# Patient Record
Sex: Male | Born: 1985 | Race: White | Hispanic: No | Marital: Married | Smoking: Current some day smoker
Health system: Southern US, Community
[De-identification: ages and names within clinical notes are randomized; demographics above are authoritative.]

---

## 2017-09-10 ENCOUNTER — Other Ambulatory Visit: Payer: Self-pay | Admitting: Physician Assistant

## 2017-09-10 DIAGNOSIS — N5089 Other specified disorders of the male genital organs: Secondary | ICD-10-CM

## 2017-09-17 ENCOUNTER — Ambulatory Visit
Admission: RE | Admit: 2017-09-17 | Discharge: 2017-09-17 | Disposition: A | Discharge status: Home / Self Care | Payer: Managed Care, Other (non HMO) | Source: Ambulatory Visit | Attending: Physician Assistant | Admitting: Physician Assistant

## 2017-09-17 DIAGNOSIS — N5089 Other specified disorders of the male genital organs: Secondary | ICD-10-CM

## 2019-12-18 DIAGNOSIS — W19XXXA Unspecified fall, initial encounter: Secondary | ICD-10-CM | POA: Diagnosis not present

## 2019-12-18 DIAGNOSIS — M25572 Pain in left ankle and joints of left foot: Secondary | ICD-10-CM | POA: Diagnosis not present

## 2019-12-18 DIAGNOSIS — M25512 Pain in left shoulder: Secondary | ICD-10-CM | POA: Diagnosis not present

## 2020-12-27 ENCOUNTER — Other Ambulatory Visit: Payer: Self-pay

## 2020-12-27 ENCOUNTER — Encounter: Payer: Self-pay | Admitting: Allergy

## 2020-12-27 ENCOUNTER — Ambulatory Visit (INDEPENDENT_AMBULATORY_CARE_PROVIDER_SITE_OTHER): Payer: BC Managed Care – PPO | Admitting: Allergy

## 2020-12-27 VITALS — BP 132/76 | HR 90 | Temp 98.4°F | Resp 16 | Ht 68.0 in | Wt 186.4 lb

## 2020-12-27 DIAGNOSIS — L509 Urticaria, unspecified: Secondary | ICD-10-CM | POA: Diagnosis not present

## 2020-12-27 MED ORDER — CETIRIZINE HCL 10 MG PO TABS: 10.0000 mg | ORAL_TABLET | Freq: Every day | ORAL | 5 refills | Status: DC

## 2020-12-27 NOTE — Patient Instructions (Addendum)
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   Start zyrtec (cetirizine) 10mg  once a day at night.  If hives are not controlled or causes drowsiness let know. [place] Keep track of episodes and take pictures of the rash.  . Avoid the following potential triggers: alcohol, tight clothing, NSAIDs, hot showers and getting overheated. . Get bloodwork:  o We are ordering labs, so please allow 1-2 weeks for the results to come back. o With the newly implemented Cures Act, the labs might be visible to you at the same time that they become visible to me. However, I will not address the results until all of the results are back, so please be patient.   Follow up in 2 months or sooner if needed.  Skin care recommendations  Bath time: . Always use lukewarm water. AVOID very hot or cold water. Marland Kitchen Keep bathing time to 5-10 minutes. . Do NOT use bubble bath. . Use a mild soap and use just enough to wash the dirty areas. . Do NOT scrub skin vigorously.  . After bathing, pat dry your skin with a towel. Do NOT rub or scrub the skin.  Moisturizers and prescriptions:  . ALWAYS apply moisturizers immediately after bathing (within 3 minutes). This helps to lock-in moisture. . Use the moisturizer several times a day over the whole body. Marland Kitchen summer moisturizers include: Aveeno, CeraVe, Cetaphil. [place] winter moisturizers include: Aquaphor, Vaseline, Cerave, Cetaphil, Eucerin, Vanicream. . When using moisturizers along with medications, the moisturizer should be applied about one hour after applying the medication to prevent diluting effect of the medication or moisturize around where you applied the medications. When not using medications, the moisturizer can be continued twice daily as maintenance.  Laundry and clothing: . Avoid laundry products with added color or perfumes. . Use unscented hypo-allergenic laundry products such as Tide free, Cheer free & gentle, and All free and clear.   . If the skin still seems dry or sensitive, you can try double-rinsing the clothes. . Avoid tight or scratchy clothing such as wool. . Do not use fabric softeners or dyer sheets.

## 2020-12-27 NOTE — Progress Notes (Signed)
New Patient Note  RE: Brent Petersen MRN: [id number] DOB: [birth year] Date of Office Visit: 12/27/2020  Referring provider: Lupita Raider, MD Primary care provider: Lupita Raider, MD  Chief Complaint: Urticaria  History of Present Illness: I had the pleasure of seeing [name] for initial evaluation at the [facility] on 12/27/2020. He is a 35 y.o. male, who is referred here by Lupita Raider, MD for the evaluation of urticaria.  Rash started about a 2 months ago. Mainly occurs on his shoulder blades, collarbone, waistline and around the socks. Describes them as itchy, red, raised. Individual rashes lasts about less than 1 day. No ecchymosis upon resolution. Associated symptoms include: none. Suspected triggers are pressure. Denies any fevers, chills, changes in medications, foods, personal care products or recent infections. He has tried the following therapies: benadryl prn with good benefit. Systemic steroids none. Currently on no daily medications.  Previous work up includes: none. Previous history of rash/hives: broke out after cat exposure, 2 outdoors cats at home.  Patient is up to date with the following cancer screening tests: no.  Wife had covid-19 in November but patient never had symptoms and tested negative. Wife is newly pregnant for about 2 months now.   Assessment and Plan: Brent Petersen is a 35 y.o. male with: Urticaria 2 months ago noted rash/hives around right shoulder, waistline and around the socks. Lasts less than 1 day. Denies changes in diet, meds or personal care products. Wife is 2 months pregnant, Covid-19 exposure in November 2021 but negative testing and asymptomatic. Hives with cat exposure in the past - 2 cat outdoors.   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   Start zyrtec (cetirizine) 10mg  once a day at night.  If hives are not controlled or causes drowsiness let know. [place] Keep track of episodes  and take pictures of the rash.  . Avoid the following potential triggers: alcohol, tight clothing, NSAIDs, hot showers and getting overheated. . Get bloodwork to rule out other etiologies.   Return in about 2 months (around 02/26/2021).  Meds ordered this encounter  Medications  . cetirizine (ZYRTEC ALLERGY) 10 MG tablet    Sig: Take 1 tablet (10 mg total) by mouth at bedtime.    Dispense:  30 tablet    Refill:  5    Lab Orders     Alpha-Gal Panel     ANA w/Reflex     CBC with Differential/Platelet     Chronic Urticaria     Comprehensive metabolic panel     Tryptase     Thyroid Cascade Profile     Sedimentation rate     C-reactive protein  Other allergy screening: Asthma: no Rhino conjunctivitis: no  Food allergy: no  Onions and peppers cause diarrhea.  Medication allergy: no Hymenoptera allergy: no Eczema:no History of recurrent infections suggestive of immunodeficency: no  Diagnostics: Skin Testing: None.  Past Medical History: Patient Active Problem List   Diagnosis Date Noted  . Urticaria 12/27/2020   History reviewed. No pertinent past medical history. Past Surgical History: History reviewed. No pertinent surgical history. Medication List:  Current Outpatient Medications  Medication Sig Dispense Refill  . cetirizine (ZYRTEC ALLERGY) 10 MG tablet Take 1 tablet (10 mg total) by mouth at bedtime. 30 tablet 5  . omeprazole (PRILOSEC) 20 MG capsule Take 20 mg by mouth daily.     No current facility-administered medications for this visit.   Allergies: No Known  Allergies Social History: Social History   Socioeconomic History  . Marital status: Married    Spouse name: Not on file  . Number of children: Not on file  . Years of education: Not on file  . Highest education level: Not on file  Occupational History  . Not on file  Tobacco Use  . Smoking status: Current Some Day Smoker  . Smokeless tobacco: Current User  Vaping Use  . Vaping Use: Never  used  Substance and Sexual Activity  . Alcohol use: Never  . Drug use: Never  . Sexual activity: Not on file  Other Topics Concern  . Not on file  Social History Narrative  . Not on file   Social Determinants of Health   Financial Resource Strain: Not on file  Food Insecurity: Not on file  Transportation Needs: Not on file  Physical Activity: Not on file  Stress: Not on file  Social Connections: Not on file   Lives in a house built in the 1900s. Smoking: 1/3rd pack per day Occupation: Health visitor History: Water Damage/mildew in the house: no Carpet in the family room: no Carpet in the bedroom: no Heating: gas Cooling: window Pet: yes 2 cats outdoors  Family History: Family History  Problem Relation Age of Onset  . Asthma Father    Problem                               Relation Eczema                                No Food allergy                          No  Allergic rhino conjunctivitis     No   Review of Systems  Constitutional: Negative for appetite change, chills, fever and unexpected weight change.  HENT: Negative for congestion and rhinorrhea.   Eyes: Negative for itching.  Respiratory: Negative for cough, chest tightness, shortness of breath and wheezing.   Cardiovascular: Negative for chest pain.  Gastrointestinal: Negative for abdominal pain.  Genitourinary: Negative for difficulty urinating.  Skin: Positive for rash.  Neurological: Negative for headaches.   Objective: BP 132/76 (BP Location: Right Arm, Patient Position: Sitting, Cuff Size: Large)   Pulse 90   Temp 98.4 F (36.9 C) (Temporal)   Resp 16   Ht 5\' 8"  (1.727 m)   Wt 186 lb 6.4 oz (84.6 kg)   SpO2 94%   BMI 28.34 kg/m  Body mass index is 28.34 kg/m. Physical Exam Vitals and nursing note reviewed.  Constitutional:      Appearance: Normal appearance. He is well-developed.  HENT:     Head: Normocephalic and atraumatic.     Right Ear: External ear normal.      Left Ear: External ear normal.     Nose: Nose normal.     Mouth/Throat:     Mouth: Mucous membranes are moist.     Pharynx: Oropharynx is clear.  Eyes:     Conjunctiva/sclera: Conjunctivae normal.  Cardiovascular:     Rate and Rhythm: Normal rate and regular rhythm.     Heart sounds: Normal heart sounds. No murmur heard. No friction rub. No gallop.   Pulmonary:     Effort: Pulmonary effort is normal.     Breath  sounds: Normal breath sounds. No wheezing, rhonchi or rales.  Abdominal:     Palpations: Abdomen is soft.  Musculoskeletal:     Cervical back: Neck supple.  Skin:    General: Skin is warm.     Findings: No rash.  Neurological:     Mental Status: He is alert and oriented to person, place, and time.  Psychiatric:        Behavior: Behavior normal.    The plan was reviewed with the patient/family, and all questions/concerned were addressed.  It was my pleasure to see Brent Petersen today and participate in his care. Please feel free to contact me with any questions or concerns.  Sincerely,  Wyline Mood, DO Allergy & Immunology  [facility] office: [phone] [facility] office: [phone]

## 2020-12-27 NOTE — Assessment & Plan Note (Signed)
2 months ago noted rash/hives around right shoulder, waistline and around the socks. Lasts less than 1 day. Denies changes in diet, meds or personal care products. Wife is 2 months pregnant, Covid-19 exposure in November 2021 but negative testing and asymptomatic. Hives with cat exposure in the past - 2 cat outdoors.   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   Start zyrtec (cetirizine) 10mg  once a day at night.  If hives are not controlled or causes drowsiness let know. [place] Keep track of episodes and take pictures of the rash.  . Avoid the following potential triggers: alcohol, tight clothing, NSAIDs, hot showers and getting overheated. . Get bloodwork to rule out other etiologies.

## 2020-12-28 ENCOUNTER — Encounter: Payer: Self-pay | Admitting: Allergy

## 2020-12-28 LAB — COMPREHENSIVE METABOLIC PANEL
Calcium: 9.8 mg/dL (ref 8.7–10.2)
Chloride: 100 mmol/L (ref 96–106)
Potassium: 4.9 mmol/L (ref 3.5–5.2)

## 2020-12-28 LAB — CBC WITH DIFFERENTIAL/PLATELET: EOS (ABSOLUTE): 0.3 10*3/uL (ref 0.0–0.4)

## 2020-12-28 LAB — ALPHA-GAL PANEL

## 2020-12-29 LAB — COMPREHENSIVE METABOLIC PANEL
AST: 41 IU/L — ABNORMAL HIGH (ref 0–40)
Glucose: 87 mg/dL (ref 65–99)
Total Protein: 7.9 g/dL (ref 6.0–8.5)

## 2020-12-29 LAB — ALPHA-GAL PANEL

## 2020-12-29 LAB — CBC WITH DIFFERENTIAL/PLATELET
Immature Granulocytes: 1 %
Neutrophils: 56 %

## 2020-12-31 LAB — CBC WITH DIFFERENTIAL/PLATELET
Hematocrit: 54.7 % — ABNORMAL HIGH (ref 37.5–51.0)
Immature Grans (Abs): 0.1 10*3/uL (ref 0.0–0.1)
MCH: 31.8 pg (ref 26.6–33.0)

## 2020-12-31 LAB — COMPREHENSIVE METABOLIC PANEL: Bilirubin Total: 0.3 mg/dL (ref 0.0–1.2)

## 2021-01-07 ENCOUNTER — Other Ambulatory Visit: Payer: Self-pay | Admitting: Allergy

## 2021-01-07 DIAGNOSIS — L509 Urticaria, unspecified: Secondary | ICD-10-CM

## 2021-01-07 LAB — COMPREHENSIVE METABOLIC PANEL
ALT: 60 IU/L — ABNORMAL HIGH (ref 0–44)
Albumin/Globulin Ratio: 1.7 (ref 1.2–2.2)
Albumin: 5 g/dL (ref 4.0–5.0)
Alkaline Phosphatase: 83 IU/L (ref 44–121)
BUN/Creatinine Ratio: 10 (ref 9–20)
BUN: 11 mg/dL (ref 6–20)
CO2: 17 mmol/L — ABNORMAL LOW (ref 20–29)
Creatinine, Ser: 1.1 mg/dL (ref 0.76–1.27)
Globulin, Total: 2.9 g/dL (ref 1.5–4.5)
Sodium: 141 mmol/L (ref 134–144)
eGFR: 91 mL/min/{1.73_m2} (ref >59)

## 2021-01-07 LAB — CBC WITH DIFFERENTIAL/PLATELET
Basophils Absolute: 0 10*3/uL (ref 0.0–0.2)
Basos: 1 %
Eos: 4 %
Hemoglobin: 18.8 g/dL — ABNORMAL HIGH (ref 13.0–17.7)
Lymphocytes Absolute: 1.9 10*3/uL (ref 0.7–3.1)
Lymphs: 28 %
MCHC: 34.4 g/dL (ref 31.5–35.7)
MCV: 92 fL (ref 79–97)
Monocytes Absolute: 0.7 10*3/uL (ref 0.1–0.9)
Monocytes: 10 %
Neutrophils Absolute: 3.9 10*3/uL (ref 1.4–7.0)
Platelets: 257 10*3/uL (ref 150–450)
RBC: 5.92 x10E6/uL — ABNORMAL HIGH (ref 4.14–5.80)
RDW: 12.8 % (ref 11.6–15.4)
WBC: 6.9 10*3/uL (ref 3.4–10.8)

## 2021-01-07 LAB — CHRONIC URTICARIA: cu index: <3.2 (ref <10)

## 2021-01-07 LAB — ALPHA-GAL PANEL
Allergen Lamb IgE: <0.1 kU/L
O215-IgE Alpha-Gal: <0.1 kU/L

## 2021-01-07 LAB — C-REACTIVE PROTEIN: CRP: 2 mg/L (ref 0–10)

## 2021-01-07 LAB — ANA W/REFLEX: Anti Nuclear Antibody (ANA): NEGATIVE

## 2021-01-07 LAB — SEDIMENTATION RATE: Sed Rate: 15 mm/hr (ref 0–15)

## 2021-01-07 LAB — TRYPTASE: Tryptase: 11.3 ug/L (ref 2.2–13.2)

## 2021-01-07 LAB — THYROID CASCADE PROFILE: TSH: 1.84 u[IU]/mL (ref 0.450–4.500)

## 2021-01-07 NOTE — Progress Notes (Signed)
Please mail lab orders to patient's home.

## 2021-02-28 ENCOUNTER — Other Ambulatory Visit: Payer: Self-pay

## 2021-02-28 ENCOUNTER — Encounter: Payer: Self-pay | Admitting: Allergy

## 2021-02-28 ENCOUNTER — Ambulatory Visit (INDEPENDENT_AMBULATORY_CARE_PROVIDER_SITE_OTHER): Payer: BC Managed Care – PPO | Admitting: Allergy

## 2021-02-28 VITALS — BP 130/82 | HR 88 | Temp 98.2°F | Resp 18 | Ht 68.0 in | Wt 190.0 lb

## 2021-02-28 DIAGNOSIS — L509 Urticaria, unspecified: Secondary | ICD-10-CM

## 2021-02-28 MED ORDER — CETIRIZINE HCL 10 MG PO TABS: 10.0000 mg | ORAL_TABLET | Freq: Every day | ORAL | 3 refills | Status: AC

## 2021-02-28 NOTE — Patient Instructions (Addendum)
Hives:  Continue zyrtec (cetirizine) 10mg  once a day at night.  If hives are not controlled or causes drowsiness let know. . If no hives for 1 month then you can try to decrease zyrtec to 5mg  once a day at night and see if that controls your symptoms.  [place] Keep track of episodes and take pictures of the rash.  . Avoid the following potential triggers: alcohol, tight clothing, NSAIDs, hot showers and getting overheated.  . Get bloodwork:  o We are ordering labs, so please allow 1-2 weeks for the results to come back. o With the newly implemented Cures Act, the labs might be visible to you at the same time that they become visible to me. However, I will not address the results until all of the results are back, so please be patient.  o In the meantime, continue recommendations in your patient instructions, including avoidance measures (if applicable), until you hear from me.  Follow up in 6 months or sooner if needed.  Skin care recommendations  Bath time: . Always use lukewarm water. AVOID very hot or cold water. Keep bathing time to 5-10 minutes. . Do NOT use bubble bath. . Use a mild soap and use just enough to wash the dirty areas. . Do NOT scrub skin vigorously.  . After bathing, pat dry your skin with a towel. Do NOT rub or scrub the skin.  Moisturizers and prescriptions:  . ALWAYS apply moisturizers immediately after bathing (within 3 minutes). This helps to lock-in moisture. . Use the moisturizer several times a day over the whole body. Marland Kitchen summer moisturizers include: Aveeno, CeraVe, Cetaphil. Marland Kitchen winter moisturizers include: Aquaphor, Vaseline, Cerave, Cetaphil, Eucerin, Vanicream. . When using moisturizers along with medications, the moisturizer should be applied about one hour after applying the medication to prevent diluting effect of the medication or moisturize around where you applied the medications. When not using medications, the moisturizer can be continued  twice daily as maintenance.  Laundry and clothing: . Avoid laundry products with added color or perfumes. . Use unscented hypo-allergenic laundry products such as Tide free, Cheer free & gentle, and All free and clear.  . If the skin still seems dry or sensitive, you can try double-rinsing the clothes. . Avoid tight or scratchy clothing such as wool. . Do not use fabric softeners or dyer sheets.

## 2021-02-28 NOTE — Assessment & Plan Note (Signed)
Past history - 2 months ago noted rash/hives around right shoulder, waistline and around the socks. Lasts less than 1 day. Denies changes in diet, meds or personal care products. Covid-19 exposure in November 2021 but negative testing and asymptomatic. Hives with cat exposure in the past - 2 cat outdoors.  Interim history - bloodwork slightly elevated AST/ALT and hgb/hct. Takes zyrtec 10mg  daily with good benefit.  Continue zyrtec (cetirizine) 10mg  once a day at night.  If hives are not controlled or causes drowsiness let know. . If no hives for 1 month then you can try to decrease zyrtec to 5mg  once a day at night and see if that controls your symptoms.  Keep track of episodes and take pictures of the rash.  . Avoid the following potential triggers: alcohol, tight clothing, NSAIDs, hot showers and getting overheated. . Get bloodwork - CBC diff and CMP.

## 2021-02-28 NOTE — Progress Notes (Signed)
Follow Up Note  RE: Brent Petersen MRN: [id number] DOB: [birth year] Date of Office Visit: 02/28/2021  Referring provider: Lupita Raider, MD Primary care provider: Lupita Raider, MD  Chief Complaint: Urticaria  History of Present Illness: I had the pleasure of seeing Brent Petersen for a follow up visit at the [facility] on 02/28/2021. He is a 35 y.o. male, who is being followed for urticaria. His previous allergy office visit was on 12/27/2020 with Dr. Selena Batten. Today is a regular follow up visit.  Urticaria Currently taking zyrtec 10mg  daily at night with no issues. Noted significant improvement in symptoms when taking it daily.  One time forgot to take for a few days and noted rash/itching.   Patient would like to get bloodwork done today.  Wife is pregnant and due in August 2022.  Assessment and Plan: Brent Petersen is a 35 y.o. male with: Urticaria Past history - 2 months ago noted rash/hives around right shoulder, waistline and around the socks. Lasts less than 1 day. Denies changes in diet, meds or personal care products. Covid-19 exposure in November 2021 but negative testing and asymptomatic. Hives with cat exposure in the past - 2 cat outdoors.  Interim history - bloodwork slightly elevated AST/ALT and hgb/hct. Takes zyrtec 10mg  daily with good benefit.  Continue zyrtec (cetirizine) 10mg  once a day at night.  If hives are not controlled or causes drowsiness let December 2021 know. . If no hives for 1 month then you can try to decrease zyrtec to 5mg  once a day at night and see if that controls your symptoms.  Keep track of episodes and take pictures of the rash.  . Avoid the following potential triggers: alcohol, tight clothing, NSAIDs, hot showers and getting overheated. . Get bloodwork - CBC diff and CMP.  Return in about 6 months (around 08/31/2021).  Meds ordered this encounter  Medications  . cetirizine (ZYRTEC ALLERGY) 10 MG tablet    Sig: Take 1 tablet (10 mg  total) by mouth daily.    Dispense:  90 tablet    Refill:  3    Lab Orders     CBC with Differential/Platelet     Comprehensive metabolic panel  Diagnostics: None.   Medication List:  Current Outpatient Medications  Medication Sig Dispense Refill  . cetirizine (ZYRTEC ALLERGY) 10 MG tablet Take 1 tablet (10 mg total) by mouth daily. 90 tablet 3  . omeprazole (PRILOSEC) 20 MG capsule Take 20 mg by mouth daily.     No current facility-administered medications for this visit.   Allergies: No Known Allergies I reviewed his past medical history, social history, family history, and environmental history and no significant changes have been reported from his previous visit.  Review of Systems  Constitutional: Negative for appetite change, chills, fever and unexpected weight change.  HENT: Negative for congestion and rhinorrhea.   Eyes: Negative for itching.  Respiratory: Negative for cough, chest tightness, shortness of breath and wheezing.   Cardiovascular: Negative for chest pain.  Gastrointestinal: Negative for abdominal pain.  Genitourinary: Negative for difficulty urinating.  Skin: Negative for rash.  Neurological: Negative for headaches.   Objective: BP 130/82 (BP Location: Left Arm, Patient Position: Sitting, Cuff Size: Normal)   Pulse 88   Temp 98.2 F (36.8 C) (Temporal)   Resp 18   Ht 5\' 8"  (1.727 m)   Wt 190 lb (86.2 kg)   SpO2 95%   BMI 28.89 kg/m  Body mass index is 28.89 kg/m.  Physical Exam Vitals and nursing note reviewed.  Constitutional:      Appearance: Normal appearance. He is well-developed.  HENT:     Head: Normocephalic and atraumatic.     Right Ear: External ear normal.     Left Ear: External ear normal.     Nose: Nose normal.     Mouth/Throat:     Mouth: Mucous membranes are moist.     Pharynx: Oropharynx is clear.  Eyes:     Conjunctiva/sclera: Conjunctivae normal.  Cardiovascular:     Rate and Rhythm: Normal rate and regular rhythm.      Heart sounds: Normal heart sounds. No murmur heard. No friction rub. No gallop.   Pulmonary:     Effort: Pulmonary effort is normal.     Breath sounds: Normal breath sounds. No wheezing, rhonchi or rales.  Abdominal:     Palpations: Abdomen is soft.  Musculoskeletal:     Cervical back: Neck supple.  Skin:    General: Skin is warm.     Findings: No rash.  Neurological:     Mental Status: He is alert and oriented to person, place, and time.  Psychiatric:        Behavior: Behavior normal.    Previous notes and tests were reviewed. The plan was reviewed with the patient/family, and all questions/concerned were addressed.  It was my pleasure to see Brent Petersen today and participate in his care. Please feel free to contact me with any questions or concerns.  Sincerely,  Wyline Mood, DO Allergy & Immunology  [facility] office: [phone] [facility] office: [phone]

## 2021-03-01 LAB — COMPREHENSIVE METABOLIC PANEL
ALT: 58 IU/L — ABNORMAL HIGH (ref 0–44)
AST: 30 IU/L (ref 0–40)
Albumin/Globulin Ratio: 1.4 (ref 1.2–2.2)
Albumin: 4.3 g/dL (ref 4.0–5.0)
Alkaline Phosphatase: 69 IU/L (ref 44–121)
BUN/Creatinine Ratio: 13 (ref 9–20)
BUN: 11 mg/dL (ref 6–20)
Bilirubin Total: 0.4 mg/dL (ref 0.0–1.2)
CO2: 25 mmol/L (ref 20–29)
Calcium: 9.4 mg/dL (ref 8.7–10.2)
Chloride: 103 mmol/L (ref 96–106)
Creatinine, Ser: 0.86 mg/dL (ref 0.76–1.27)
Globulin, Total: 3 g/dL (ref 1.5–4.5)
Glucose: 84 mg/dL (ref 65–99)
Potassium: 4.7 mmol/L (ref 3.5–5.2)
Sodium: 143 mmol/L (ref 134–144)
Total Protein: 7.3 g/dL (ref 6.0–8.5)
eGFR: 117 mL/min/{1.73_m2} (ref >59)

## 2021-03-01 LAB — CBC WITH DIFFERENTIAL/PLATELET
Basophils Absolute: 0.1 10*3/uL (ref 0.0–0.2)
Basos: 1 %
EOS (ABSOLUTE): 0.6 10*3/uL — ABNORMAL HIGH (ref 0.0–0.4)
Eos: 7 %
Hematocrit: 48.7 % (ref 37.5–51.0)
Hemoglobin: 16.5 g/dL (ref 13.0–17.7)
Immature Grans (Abs): 0.1 10*3/uL (ref 0.0–0.1)
Immature Granulocytes: 2 %
Lymphocytes Absolute: 2 10*3/uL (ref 0.7–3.1)
Lymphs: 23 %
MCH: 31.7 pg (ref 26.6–33.0)
MCHC: 33.9 g/dL (ref 31.5–35.7)
MCV: 94 fL (ref 79–97)
Monocytes Absolute: 0.8 10*3/uL (ref 0.1–0.9)
Monocytes: 9 %
Neutrophils Absolute: 5.1 10*3/uL (ref 1.4–7.0)
Neutrophils: 58 %
Platelets: 213 10*3/uL (ref 150–450)
RBC: 5.2 x10E6/uL (ref 4.14–5.80)
RDW: 13 % (ref 11.6–15.4)
WBC: 8.6 10*3/uL (ref 3.4–10.8)

## 2021-08-22 DIAGNOSIS — Z3009 Encounter for other general counseling and advice on contraception: Secondary | ICD-10-CM | POA: Diagnosis not present

## 2021-08-29 ENCOUNTER — Ambulatory Visit: Payer: BC Managed Care – PPO | Admitting: Allergy

## 2021-09-20 DIAGNOSIS — Z302 Encounter for sterilization: Secondary | ICD-10-CM | POA: Diagnosis not present

## 2022-01-16 DIAGNOSIS — R079 Chest pain, unspecified: Secondary | ICD-10-CM | POA: Diagnosis not present

## 2022-01-16 DIAGNOSIS — K219 Gastro-esophageal reflux disease without esophagitis: Secondary | ICD-10-CM | POA: Diagnosis not present

## 2022-01-16 DIAGNOSIS — Z23 Encounter for immunization: Secondary | ICD-10-CM | POA: Diagnosis not present

## 2022-01-16 DIAGNOSIS — E669 Obesity, unspecified: Secondary | ICD-10-CM | POA: Diagnosis not present

## 2022-01-30 DIAGNOSIS — J029 Acute pharyngitis, unspecified: Secondary | ICD-10-CM | POA: Diagnosis not present

## 2022-02-06 DIAGNOSIS — R079 Chest pain, unspecified: Secondary | ICD-10-CM | POA: Diagnosis not present

## 2022-02-06 DIAGNOSIS — M940 Chondrocostal junction syndrome [Tietze]: Secondary | ICD-10-CM | POA: Diagnosis not present

## 2022-02-06 DIAGNOSIS — F411 Generalized anxiety disorder: Secondary | ICD-10-CM | POA: Diagnosis not present

## 2022-02-08 ENCOUNTER — Other Ambulatory Visit: Payer: Self-pay | Admitting: Family Medicine

## 2022-02-08 ENCOUNTER — Ambulatory Visit
Admission: RE | Admit: 2022-02-08 | Discharge: 2022-02-08 | Disposition: A | Discharge status: Home / Self Care | Payer: BC Managed Care – PPO | Source: Ambulatory Visit | Attending: Family Medicine | Admitting: Family Medicine

## 2022-02-08 DIAGNOSIS — R079 Chest pain, unspecified: Secondary | ICD-10-CM

## 2022-03-06 DIAGNOSIS — R1013 Epigastric pain: Secondary | ICD-10-CM | POA: Diagnosis not present

## 2022-03-06 DIAGNOSIS — E7800 Pure hypercholesterolemia, unspecified: Secondary | ICD-10-CM | POA: Diagnosis not present

## 2022-03-06 DIAGNOSIS — K219 Gastro-esophageal reflux disease without esophagitis: Secondary | ICD-10-CM | POA: Diagnosis not present

## 2022-03-06 DIAGNOSIS — F411 Generalized anxiety disorder: Secondary | ICD-10-CM | POA: Diagnosis not present

## 2022-03-07 ENCOUNTER — Other Ambulatory Visit: Payer: Self-pay | Admitting: Physician Assistant

## 2022-03-07 DIAGNOSIS — R1013 Epigastric pain: Secondary | ICD-10-CM

## 2022-03-20 ENCOUNTER — Ambulatory Visit
Admission: RE | Admit: 2022-03-20 | Discharge: 2022-03-20 | Disposition: A | Discharge status: Home / Self Care | Payer: BC Managed Care – PPO | Source: Ambulatory Visit | Attending: Physician Assistant | Admitting: Physician Assistant

## 2022-03-20 DIAGNOSIS — R1013 Epigastric pain: Secondary | ICD-10-CM | POA: Diagnosis not present

## 2022-05-15 DIAGNOSIS — E7800 Pure hypercholesterolemia, unspecified: Secondary | ICD-10-CM | POA: Diagnosis not present

## 2022-05-22 DIAGNOSIS — H43391 Other vitreous opacities, right eye: Secondary | ICD-10-CM | POA: Diagnosis not present

## 2022-05-22 DIAGNOSIS — D3132 Benign neoplasm of left choroid: Secondary | ICD-10-CM | POA: Diagnosis not present

## 2022-05-22 DIAGNOSIS — Q141 Congenital malformation of retina: Secondary | ICD-10-CM | POA: Diagnosis not present

## 2022-11-27 DIAGNOSIS — H43391 Other vitreous opacities, right eye: Secondary | ICD-10-CM | POA: Diagnosis not present

## 2022-11-27 DIAGNOSIS — Q141 Congenital malformation of retina: Secondary | ICD-10-CM | POA: Diagnosis not present

## 2022-11-27 DIAGNOSIS — D3132 Benign neoplasm of left choroid: Secondary | ICD-10-CM | POA: Diagnosis not present

## 2022-12-25 DIAGNOSIS — R41840 Attention and concentration deficit: Secondary | ICD-10-CM | POA: Diagnosis not present

## 2022-12-25 DIAGNOSIS — F411 Generalized anxiety disorder: Secondary | ICD-10-CM | POA: Diagnosis not present

## 2023-04-23 DIAGNOSIS — Z5181 Encounter for therapeutic drug level monitoring: Secondary | ICD-10-CM | POA: Diagnosis not present

## 2023-04-23 DIAGNOSIS — Z0000 Encounter for general adult medical examination without abnormal findings: Secondary | ICD-10-CM | POA: Diagnosis not present

## 2023-04-23 DIAGNOSIS — F411 Generalized anxiety disorder: Secondary | ICD-10-CM | POA: Diagnosis not present

## 2023-04-23 DIAGNOSIS — E7800 Pure hypercholesterolemia, unspecified: Secondary | ICD-10-CM | POA: Diagnosis not present

## 2023-04-23 DIAGNOSIS — F17200 Nicotine dependence, unspecified, uncomplicated: Secondary | ICD-10-CM | POA: Diagnosis not present

## 2023-04-23 LAB — LAB REPORT - SCANNED
A1c: 5.5
EGFR: 101

## 2023-05-03 DIAGNOSIS — Z03818 Encounter for observation for suspected exposure to other biological agents ruled out: Secondary | ICD-10-CM | POA: Diagnosis not present

## 2023-05-03 DIAGNOSIS — R519 Headache, unspecified: Secondary | ICD-10-CM | POA: Diagnosis not present

## 2023-05-03 DIAGNOSIS — R509 Fever, unspecified: Secondary | ICD-10-CM | POA: Diagnosis not present

## 2023-05-03 DIAGNOSIS — J029 Acute pharyngitis, unspecified: Secondary | ICD-10-CM | POA: Diagnosis not present

## 2023-07-15 NOTE — Progress Notes (Unsigned)
Cardiology Office Note:    Date:  07/16/2023   ID:  Brent Petersen, DOB 31-Dec-1985, MRN [id number]  PCP:  Lupita Raider, MD   [facility] Providers Cardiologist:  None     Referring MD: Milus Height, PA   No chief complaint on file. High cholesterol  History of Present Illness:    Brent Petersen is a 37 y.o. male , active vaping pmhx, referral for HLD c/f FH. TC 305 mg/dL, LDL 409 mg/dL, HDL 38 mg/dL, TG 811 mg/dL 06/16/4781. He's taken crestor 20 mg daily at least since April 2024.  With his labs in July he went up to 40 mg daily.   Both of his parents have HLD. Father may have had a CABG; at 42.  His father's sister had a CABG recently.  Current Medications: Current Outpatient Medications on File Prior to Visit  Medication Sig Dispense Refill   cetirizine (ZYRTEC ALLERGY) 10 MG tablet Take 1 tablet (10 mg total) by mouth daily. 90 tablet 3   hydrOXYzine (ATARAX) 10 MG tablet Take 10 mg by mouth every 8 (eight) hours as needed.     omeprazole (PRILOSEC) 20 MG capsule Take 20 mg by mouth daily.     rosuvastatin (CRESTOR) 40 MG tablet Take 40 mg by mouth daily.     No current facility-administered medications on file prior to visit.     Allergies:   Patient has no known allergies.   Social History   Socioeconomic History   Marital status: Married    Spouse name: Not on file   Number of children: Not on file   Years of education: Not on file   Highest education level: Not on file  Occupational History   Not on file  Tobacco Use   Smoking status: Some Days   Smokeless tobacco: Current  Vaping Use   Vaping status: Never Used  Substance and Sexual Activity   Alcohol use: Never   Drug use: Never   Sexual activity: Not on file  Other Topics Concern   Not on file  Social History Narrative   Not on file   Social Determinants of Health   Financial Resource Strain: Not on file  Food Insecurity: Not on file  Transportation Needs: Not on file  Physical  Activity: Not on file  Stress: Not on file  Social Connections: Not on file    Family History: The patient's family history includes Asthma in his father.  ROS:   Please see the history of present illness.     All other systems reviewed and are negative.  EKGs/Labs/Other Studies Reviewed:    The following studies were reviewed today:   EKG Interpretation Date/Time:  Monday July 16 2023 10:01:12 EDT Ventricular Rate:  70 PR Interval:  158 QRS Duration:  80 QT Interval:  366 QTC Calculation: 395 R Axis:   34  Text Interpretation: Normal sinus rhythm Normal ECG No previous ECGs available Confirmed by Carolan Clines ([id number]) on 07/16/2023 10:13:48 AM    Recent Labs: No results found for requested labs within last 365 days.   Recent Lipid Panel No results found for: "CHOL", "TRIG", "HDL", "CHOLHDL", "VLDL", "LDLCALC", "LDLDIRECT"   Risk Assessment/Calculations:          Physical Exam:    VS:   Vitals:   07/16/23 0954  BP: 128/80  Pulse: 70  SpO2: 95%     Wt Readings from Last 3 Encounters:  07/16/23 180 lb (81.6 kg)  02/28/21 190 lb (86.2 kg)  12/27/20 186 lb 6.4 oz (84.6 kg)     Physical Exam Gen: well appearing Neuro: alert and oriented CV: r,r,r no murmurs.  Pulm: CLAB Abd: non distended Ext: No LE edema Skin: warm and well perfused Psych: normal mood ASSESSMENT:    Likely has FH  PLAN:    In order of problems listed above:   Fasting lipids today Continue crestor 40 mg daily Referral to lipid clinic     Medication Adjustments/Labs and Tests Ordered: Current medicines are reviewed at length with the patient today.  Concerns regarding medicines are outlined above.  Orders Placed This Encounter  Procedures   EKG 12-Lead   No orders of the defined types were placed in this encounter.   There are no Patient Instructions on file for this visit.   Signed, Maisie Fus, MD  07/16/2023 10:14 AM    [facility]

## 2023-07-16 ENCOUNTER — Encounter: Payer: Self-pay | Admitting: Internal Medicine

## 2023-07-16 ENCOUNTER — Ambulatory Visit: Payer: BC Managed Care – PPO | Attending: Internal Medicine | Admitting: Internal Medicine

## 2023-07-16 VITALS — BP 128/80 | HR 70 | Ht 68.0 in | Wt 180.0 lb

## 2023-07-16 DIAGNOSIS — Z136 Encounter for screening for cardiovascular disorders: Secondary | ICD-10-CM

## 2023-07-16 LAB — LIPID PANEL
Chol/HDL Ratio: 4.3 {ratio} (ref 0.0–5.0)
Cholesterol, Total: 190 mg/dL (ref 100–199)
HDL: 44 mg/dL (ref >39)
LDL Chol Calc (NIH): 122 mg/dL — ABNORMAL HIGH (ref 0–99)
Triglycerides: 136 mg/dL (ref 0–149)
VLDL Cholesterol Cal: 24 mg/dL (ref 5–40)

## 2023-07-16 NOTE — Patient Instructions (Signed)
Medication Instructions:  Your physician recommends that you continue on your current medications as directed. Please refer to the Current Medication list given to you today.  *If you need a refill on your cardiac medications before your next appointment, please call your pharmacy*   Lab Work: TODAY:  LIPID  If you have labs (blood work) drawn today and your tests are completely normal, you will receive your results only by: MyChart Message (if you have MyChart) OR A paper copy in the mail If you have any lab test that is abnormal or we need to change your treatment, we will call you to review the results.   Testing/Procedures: None ordered   You have been referred to Dr. Rennis Golden in the Lipid Clinic    Follow-Up: At [facility], you and your health needs are our priority.  As part of our continuing mission to provide you with exceptional heart care, we have created designated Provider Care Teams.  These Care Teams include your primary Cardiologist (physician) and Advanced Practice Providers (APPs -  Physician Assistants and Nurse Practitioners) who all work together to provide you with the care you need, when you need it.  We recommend signing up for the patient portal called "MyChart".  Sign up information is provided on this After Visit Summary.  MyChart is used to connect with patients for Virtual Visits (Telemedicine).  Patients are able to view lab/test results, encounter notes, upcoming appointments, etc.  Non-urgent messages can be sent to your provider as well.   To learn more about what you can do with MyChart, go to [email or web address].    Your next appointment:      Provider:       Other Instructions

## 2023-07-17 ENCOUNTER — Other Ambulatory Visit: Payer: Self-pay | Admitting: Internal Medicine

## 2023-07-17 ENCOUNTER — Other Ambulatory Visit (HOSPITAL_COMMUNITY): Payer: Self-pay

## 2023-07-17 DIAGNOSIS — E785 Hyperlipidemia, unspecified: Secondary | ICD-10-CM

## 2023-07-17 MED ORDER — EZETIMIBE 10 MG PO TABS
10.0000 mg | ORAL_TABLET | Freq: Every day | ORAL | 3 refills | Status: DC
  Filled 2023-07-17 – 2023-07-31 (×2): qty 30, 30d supply, fill #0

## 2023-07-27 ENCOUNTER — Other Ambulatory Visit (HOSPITAL_COMMUNITY): Payer: Self-pay

## 2023-07-30 IMAGING — US US ABDOMEN LIMITED
1 series · 14 of 25 positions shown · non-contrast
Comparison: None Available.

CLINICAL DATA: Epigastric pain

EXAM:
ULTRASOUND ABDOMEN LIMITED RIGHT UPPER QUADRANT

[Series 1: us abdomen limited · 0.20mm/px · 14 of 47 slices shown]
[im 1/47]
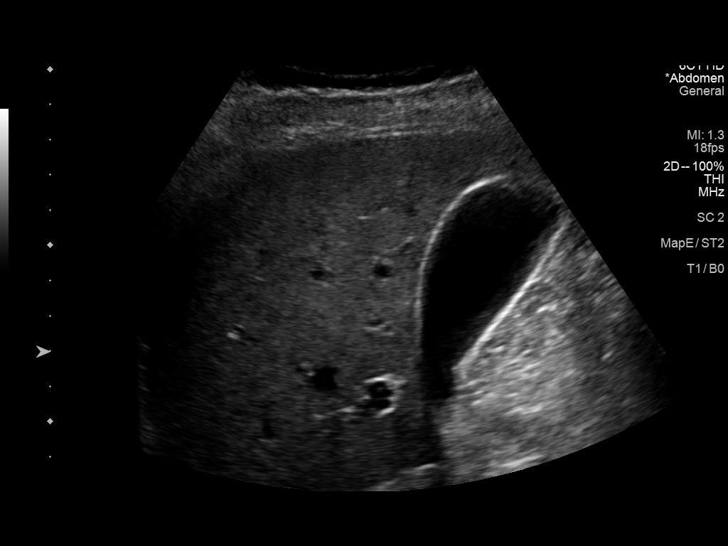
[im 4/47]
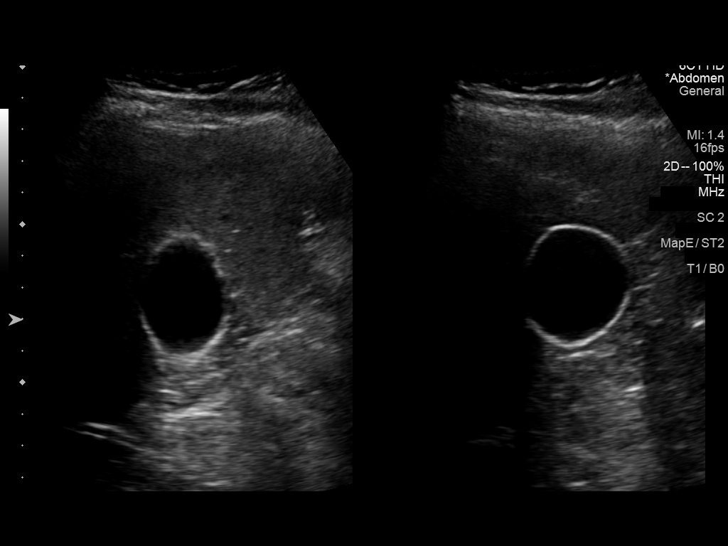
[im 8/47]
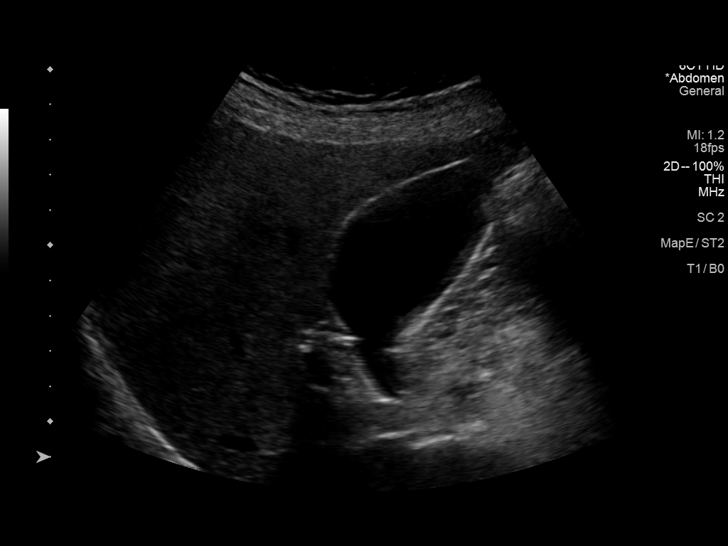
[im 12/47]
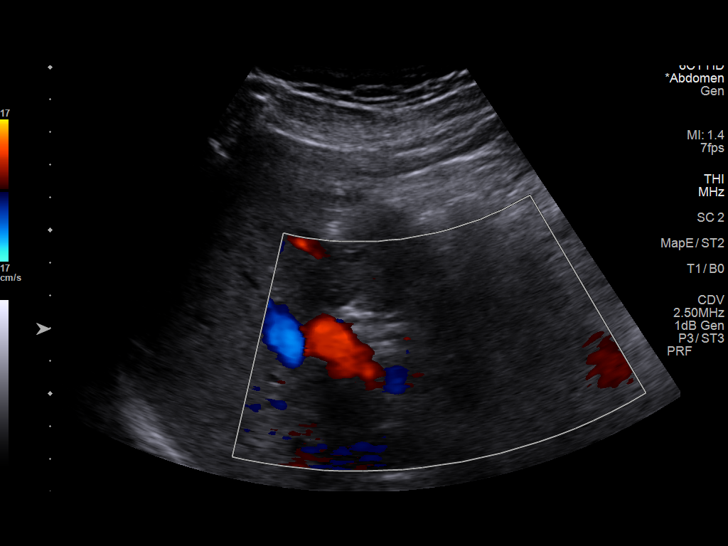
[im 16/47]
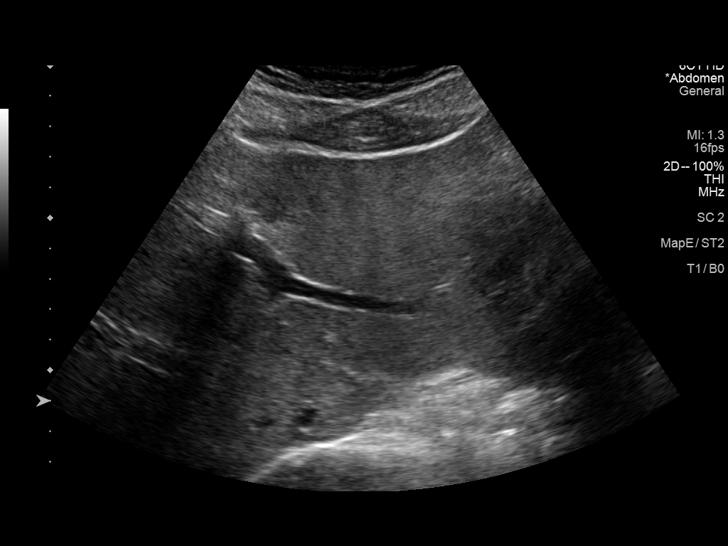
[im 18/47]
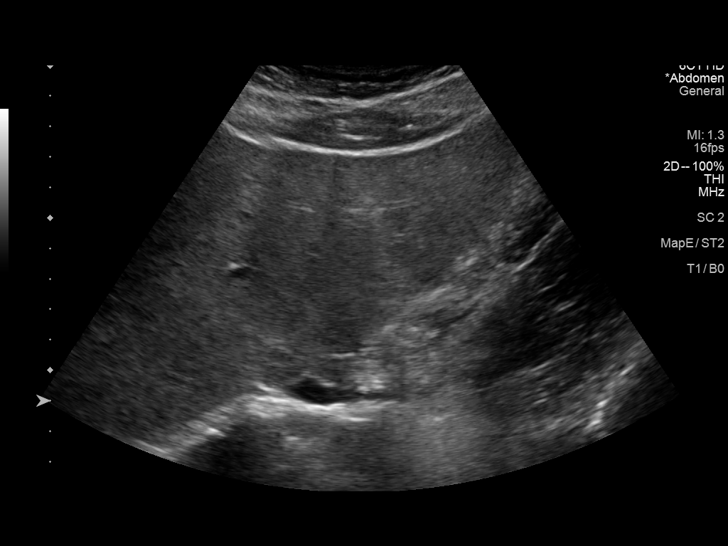
[im 22/47]
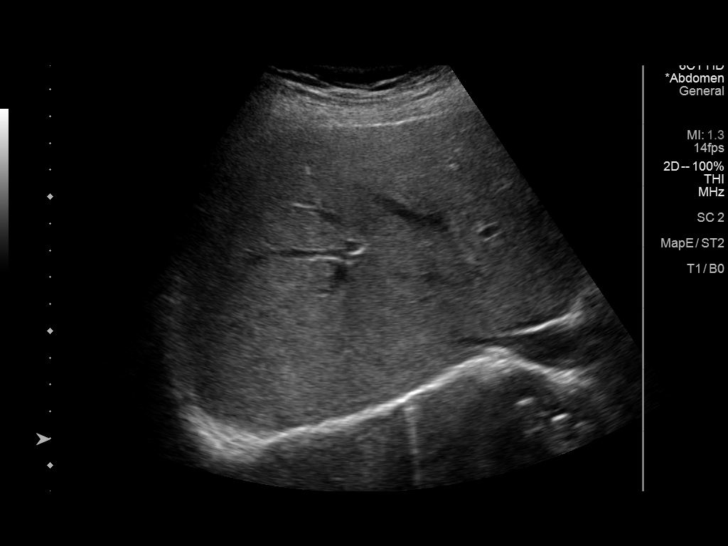
[im 25/47]
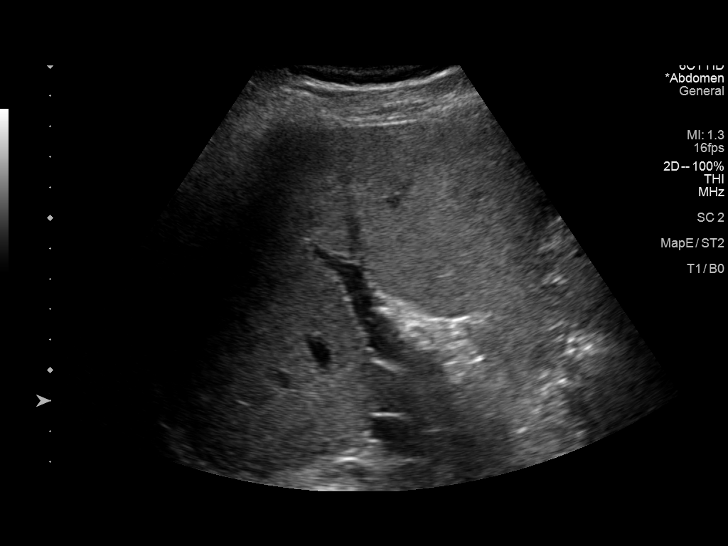
[im 29/47]
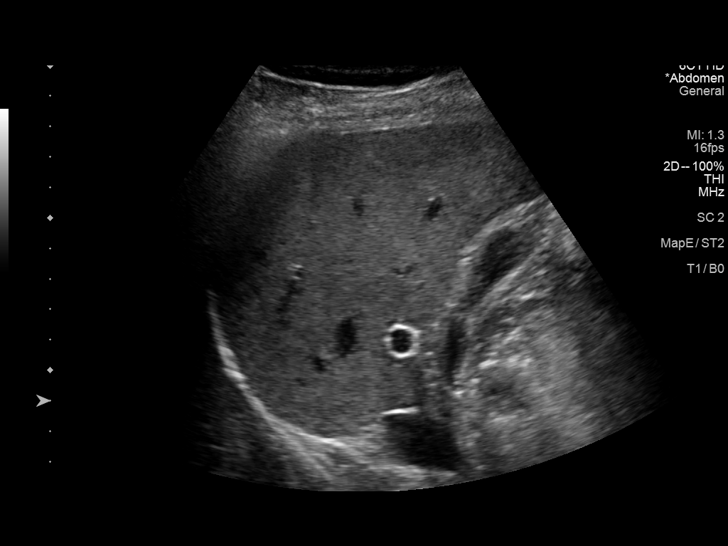
[im 31/47]
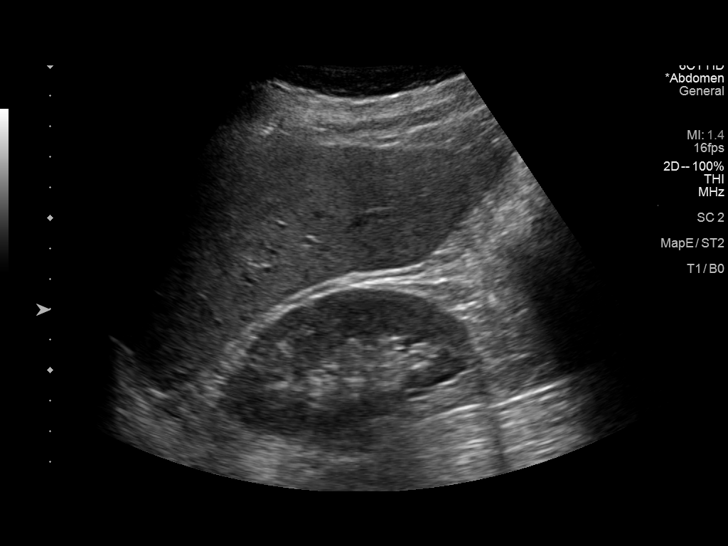
[im 35/47]
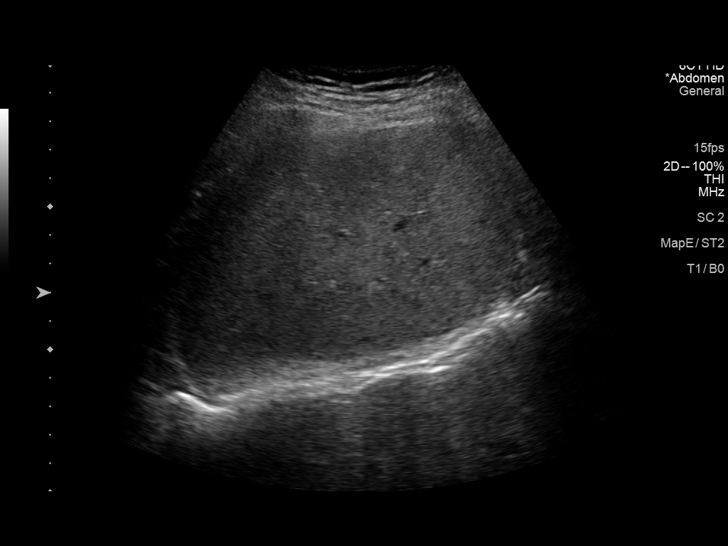
[im 39/47]
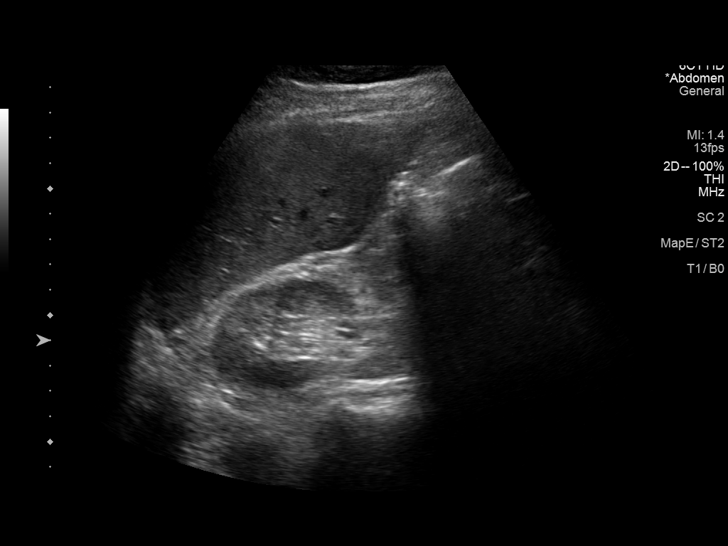
[im 43/47]
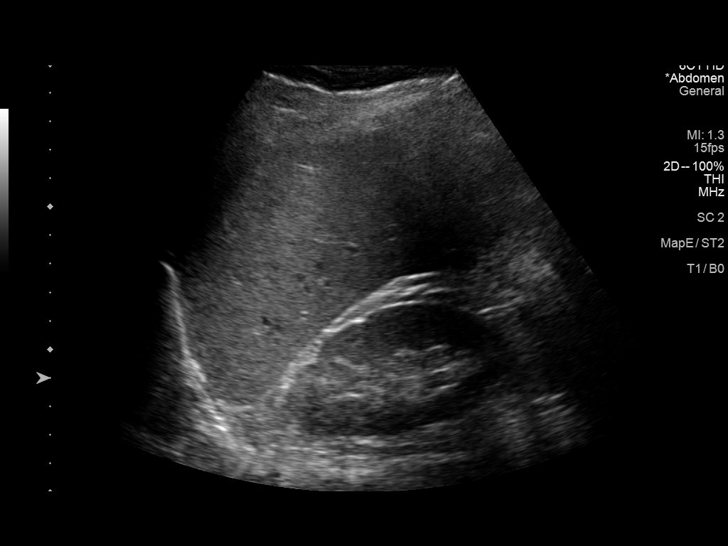
[im 47/47]
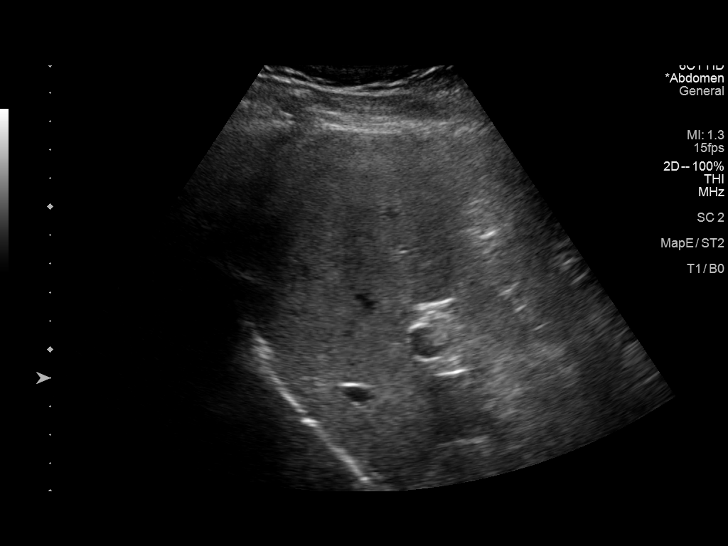

[14 of 25 positions shown; findings below may reference images not displayed]

FINDINGS: Gallbladder:

No gallstones or wall thickening visualized. No sonographic Murphy
sign noted by sonographer.

Common bile duct:

Diameter: 3 mm

Liver:

No focal lesion identified. Within normal limits in parenchymal
echogenicity. Portal vein is patent on color Doppler imaging with
normal direction of blood flow towards the liver.

Other: None.
IMPRESSION: No acute process identified.

## 2023-07-31 ENCOUNTER — Other Ambulatory Visit (HOSPITAL_COMMUNITY): Payer: Self-pay

## 2023-09-10 DIAGNOSIS — T781XXA Other adverse food reactions, not elsewhere classified, initial encounter: Secondary | ICD-10-CM | POA: Diagnosis not present

## 2023-09-10 DIAGNOSIS — J3089 Other allergic rhinitis: Secondary | ICD-10-CM | POA: Diagnosis not present

## 2023-09-10 DIAGNOSIS — J3081 Allergic rhinitis due to animal (cat) (dog) hair and dander: Secondary | ICD-10-CM | POA: Diagnosis not present

## 2023-09-10 DIAGNOSIS — J301 Allergic rhinitis due to pollen: Secondary | ICD-10-CM | POA: Diagnosis not present

## 2023-09-24 ENCOUNTER — Ambulatory Visit: Payer: BC Managed Care – PPO | Attending: Internal Medicine | Admitting: Internal Medicine

## 2023-09-24 ENCOUNTER — Encounter: Payer: Self-pay | Admitting: Internal Medicine

## 2023-09-24 VITALS — BP 134/90 | HR 74 | Ht 68.0 in | Wt 185.0 lb

## 2023-09-24 DIAGNOSIS — E785 Hyperlipidemia, unspecified: Secondary | ICD-10-CM | POA: Diagnosis not present

## 2023-09-24 DIAGNOSIS — E7849 Other hyperlipidemia: Secondary | ICD-10-CM

## 2023-09-24 MED ORDER — EZETIMIBE 10 MG PO TABS: 10.0000 mg | ORAL_TABLET | Freq: Every day | ORAL | 3 refills | Status: AC

## 2023-09-24 NOTE — Patient Instructions (Signed)
Medication Instructions:  NO CHANGES  *If you need a refill on your cardiac medications before your next appointment, please call your pharmacy*   Lab Work: NMR lipoprofile and and LPa today  If you have labs (blood work) drawn today and your tests are completely normal, you will receive your results only by: MyChart Message (if you have MyChart) OR A paper copy in the mail If you have any lab test that is abnormal or we need to change your treatment, we will call you to review the results.   Testing/Procedures: CPT codes for the Dyslipidemia and ASCVD panel are as follows: 81401, 81405, 81406.  Diagnosis code: e78.49 (familial hyperlipidemia)   Follow-Up: At [facility], you and your health needs are our priority.  As part of our continuing mission to provide you with exceptional heart care, we have created designated Provider Care Teams.  These Care Teams include your primary Cardiologist (physician) and Advanced Practice Providers (APPs -  Physician Assistants and Nurse Practitioners) who all work together to provide you with the care you need, when you need it.  We recommend signing up for the patient portal called "MyChart".  Sign up information is provided on this After Visit Summary.  MyChart is used to connect with patients for Virtual Visits (Telemedicine).  Patients are able to view lab/test results, encounter notes, upcoming appointments, etc.  Non-urgent messages can be sent to your provider as well.   To learn more about what you can do with MyChart, go to [email or web address].    Your next appointment:   3-4 months with Dr. Rennis Golden

## 2023-09-24 NOTE — Progress Notes (Signed)
LIPID CLINIC CONSULT NOTE  Chief Complaint:  Possible familial hyperlipidemia  Primary Care Physician: Brent Height, PA  Primary Cardiologist:  None  HPI:  Brent Petersen is a 37 y.o. male who is being seen today for the evaluation of familial hyperlipidemia at the request of Brent Petersen, Brent Spittle, MD. this a pleasant 37 year old male kindly referred for evaluation management of dyslipidemia.  He was recently seen by one of my partners and thought to have a familial hyperlipidemia.  Untreated LDL cholesterol of 210, total cholesterol 305, HDL 38 and triglycerides 281.  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  He had been on rosuvastatin 40 mg daily for about a couple years.  Most recent lipid testing showed total cholesterol 190, triglycerides 136, HDL 44 and LDL 122.  Based on that it was recommended that he add ezetimibe for additional lipid-lowering.  Guidelines recommend 50% reduction to treat to target LDL less than 70 (not 100) if able to be achieved.  It is noted that there is family history of high cholesterol in both parents and his father had coronary disease and CABG at age 93.  His father sister also had recent CABG.  He is active and tries to watch saturated fats in his diet.  He has 3 young kids in daycare.  PMHx:  No past medical history on file.  No past surgical history on file.  FAMHx:  Family History  Problem Relation Age of Onset   Asthma Father     SOCHx:   reports that he has been smoking. He uses smokeless tobacco. He reports that he does not drink alcohol and does not use drugs.  ALLERGIES:  No Known Allergies  ROS: Pertinent items noted in HPI and remainder of comprehensive ROS otherwise negative.  HOME MEDS: Current Outpatient Medications on File Prior to Visit  Medication Sig Dispense Refill   cetirizine (ZYRTEC ALLERGY) 10 MG tablet Take 1 tablet (10 mg total) by mouth daily. 90 tablet 3   hydrOXYzine (ATARAX) 10 MG tablet  Take 10 mg by mouth every 8 (eight) hours as needed.     omeprazole (PRILOSEC) 20 MG capsule Take 20 mg by mouth daily.     rosuvastatin (CRESTOR) 40 MG tablet Take 40 mg by mouth daily.     No current facility-administered medications on file prior to visit.    LABS/IMAGING: No results found for this or any previous visit (from the past 48 hour(s)). No results found.  LIPID PANEL:    Component Value Date/Time   CHOL 190 07/16/2023 1034   TRIG 136 07/16/2023 1034   HDL 44 07/16/2023 1034   CHOLHDL 4.3 07/16/2023 1034   LDLCALC 122 (H) 07/16/2023 1034    WEIGHTS: Wt Readings from Last 3 Encounters:  09/24/23 185 lb (83.9 kg)  07/16/23 180 lb (81.6 kg)  02/28/21 190 lb (86.2 kg)    VITALS: BP (!) 134/90   Pulse 74   Ht 5\' 8"  (1.727 m)   Wt 185 lb (83.9 kg)   SpO2 97%   BMI 28.13 kg/m   EXAM: Deferred  EKG: Deferred  ASSESSMENT: Probable familial hyperlipidemia, LDL greater than 190, based on Brent Petersen criteria History of high triglycerides, ?  Familial combined hyperlipidemia Brent Petersen history of high cholesterol in both parents and cardiovascular disease in his father and paternal aunt  PLAN: 1.   Mr. Brent Petersen has probable familial hyperlipidemia based on front Brent Petersen criteria.  He is responded well to rosuvastatin however his  cholesterol still remained above target.  Recently ezetimibe was added.  He has not had repeat lipid testing.  He is fasting today.  Will obtain an NMR and LP(a).  I did also advise genetic testing.  He understands this may be up to a $299 cost if not covered by insurance however I suspect it would be.  I will provide him with the ICD-9 code today to reach out to [employer] to see if it is covered by his insurance and if he wants to proceed with genetic testing we could schedule him to come by the office for cheek swab.  Plan follow-up likely in about 4 months.  He may need additional therapies to reach target.  Thanks again for the kind  referral.  Brent Nose, MD, [facility]  [place]  [facility]  Medical Director of the Advanced Lipid Disorders &  Cardiovascular Risk Reduction Clinic Diplomate of the American Board of Clinical Lipidology Attending Cardiologist  Direct Dial: [phone]  Fax: [phone]  Website:  [email or web address] Nicely Brent Petersen 09/24/2023, 10:16 AM

## 2023-09-25 LAB — NMR, LIPOPROFILE
Cholesterol, Total: 170 mg/dL (ref 100–199)
HDL Particle Number: 47 umol/L (ref >=30.5)
HDL-C: 62 mg/dL (ref >39)
LDL Particle Number: 1233 nmol/L — ABNORMAL HIGH (ref <1000)
LDL Size: 21 nmol (ref >20.5)
LDL-C (NIH Calc): 90 mg/dL (ref 0–99)
LP-IR Score: 53 — ABNORMAL HIGH (ref <=45)
Small LDL Particle Number: 757 nmol/L — ABNORMAL HIGH (ref <=527)
Triglycerides: 101 mg/dL (ref 0–149)

## 2023-09-25 LAB — LIPOPROTEIN A (LPA): Lipoprotein (a): 135.4 nmol/L — ABNORMAL HIGH (ref <75.0)

## 2023-10-15 ENCOUNTER — Telehealth: Payer: Self-pay | Admitting: Pharmacy Technician

## 2023-10-15 ENCOUNTER — Other Ambulatory Visit (HOSPITAL_COMMUNITY): Payer: Self-pay

## 2023-10-15 NOTE — Telephone Encounter (Signed)
-----   Message from Nurse Eileen Stanford E sent at 10/15/2023 12:52 PM EST ----- Regarding: PA for repatha needed This patient needs a PA for Repatha [employer]  LDL 90 (untreated 210 - familial hypercholesterolemia) Elevated LPa  Thanks

## 2023-10-15 NOTE — Telephone Encounter (Signed)
Pharmacy Patient Advocate Encounter   Received notification from Physician's Office that prior authorization for repatha is required/requested.   Insurance verification completed.   The patient is insured through  rx advance prescrip  .   Per test claim: The current 10/15/23 day co-pay is, $80.00- one month.  No PA needed at this time. This test claim was processed through [facility]- copay amounts may vary at other pharmacies due to pharmacy/plan contracts, or as the patient moves through the different stages of their insurance plan.

## 2023-11-19 DIAGNOSIS — D3132 Benign neoplasm of left choroid: Secondary | ICD-10-CM | POA: Diagnosis not present

## 2023-11-19 DIAGNOSIS — H43391 Other vitreous opacities, right eye: Secondary | ICD-10-CM | POA: Diagnosis not present

## 2023-11-26 NOTE — Telephone Encounter (Signed)
 Patient expressed concern about copay amount and has not responded to subsequent messages about Rx despite having reviewed.   Encounter closed until there is further communication.

## 2024-01-14 ENCOUNTER — Encounter: Payer: Self-pay | Admitting: Internal Medicine

## 2024-01-14 ENCOUNTER — Ambulatory Visit: Payer: BC Managed Care – PPO | Attending: Internal Medicine | Admitting: Internal Medicine

## 2024-01-14 VITALS — BP 122/88 | HR 80 | Ht 68.0 in | Wt 184.6 lb

## 2024-01-14 DIAGNOSIS — E7849 Other hyperlipidemia: Secondary | ICD-10-CM

## 2024-01-14 DIAGNOSIS — E7841 Elevated Lipoprotein(a): Secondary | ICD-10-CM

## 2024-01-14 MED ORDER — REPATHA SURECLICK 140 MG/ML ~~LOC~~ SOAJ: 140.0000 mg | SUBCUTANEOUS | 0 refills | Status: AC

## 2024-01-14 NOTE — Progress Notes (Signed)
 LIPID CLINIC CONSULT NOTE  Chief Complaint:  Possible familial hyperlipidemia  Primary Care Physician: Milus Height, PA  Primary Cardiologist:  None  HPI:  Brent Petersen is a 38 y.o. male who is being seen today for the evaluation of familial hyperlipidemia at the request of Redmon, Noelle, [place]. this a pleasant 38 year old male kindly referred for evaluation management of dyslipidemia.  He was recently seen by one of my partners and thought to have a familial hyperlipidemia.  Untreated LDL cholesterol of 210, total cholesterol 305, HDL 38 and triglycerides 281.  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  He had been on rosuvastatin 40 mg daily for about a couple years.  Most recent lipid testing showed total cholesterol 190, triglycerides 136, HDL 44 and LDL 122.  Based on that it was recommended that he add ezetimibe for additional lipid-lowering.  Guidelines recommend 50% reduction to treat to target LDL less than 70 (not 100) if able to be achieved.  It is noted that there is family history of high cholesterol in both parents and his father had coronary disease and CABG at age 98.  His father sister also had recent CABG.  He is active and tries to watch saturated fats in his diet.  He has 3 young kids in daycare.  01/14/2024  Brent Petersen is seen today in follow-up.  We reached out to him because of his recent cholesterol numbers.  His LDL particle number was [id number] with an LDL of 90, HDL 62.  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  He is ready on rosuvastatin 40 mg daily and ezetimibe 10 mg daily.  He says he overall feels very well.  I did advise trying a PCSK9 inhibitor to further lower his risk to reach an LDL target less than 70 and hopefully give him some benefit with his LP(a).  Cost would be about $80 a month based on his insurance co-pay, which may be too much for him at this point but he is willing to try it today after discussion.  Will try to see if we can  get him some samples.  PMHx:  No past medical history on file.  No past surgical history on file.  FAMHx:  Family History  Problem Relation Age of Onset   Asthma Father     SOCHx:   reports that he has never smoked. He has never used smokeless tobacco. He reports that he does not drink alcohol and does not use drugs.  ALLERGIES:  No Known Allergies  ROS: Pertinent items noted in HPI and remainder of comprehensive ROS otherwise negative.  HOME MEDS: Current Outpatient Medications on File Prior to Visit  Medication Sig Dispense Refill   cetirizine (ZYRTEC ALLERGY) 10 MG tablet Take 1 tablet (10 mg total) by mouth daily. 90 tablet 3   hydrOXYzine (ATARAX) 10 MG tablet Take 10 mg by mouth every 8 (eight) hours as needed.     omeprazole (PRILOSEC) 20 MG capsule Take 20 mg by mouth daily.     rosuvastatin (CRESTOR) 40 MG tablet Take 40 mg by mouth daily.     ezetimibe (ZETIA) 10 MG tablet Take 1 tablet (10 mg total) by mouth daily. 90 tablet 3   No current facility-administered medications on file prior to visit.    LABS/IMAGING: No results found for this or any previous visit (from the past 48 hours). No results found.  LIPID PANEL:    Component Value Date/Time   CHOL 190 07/16/2023  1034   TRIG 136 07/16/2023 1034   HDL 44 07/16/2023 1034   CHOLHDL 4.3 07/16/2023 1034   LDLCALC 122 (H) 07/16/2023 1034    WEIGHTS: Wt Readings from Last 3 Encounters:  01/14/24 184 lb 9.6 oz (83.7 kg)  09/24/23 185 lb (83.9 kg)  07/16/23 180 lb (81.6 kg)    VITALS: BP 122/88 (BP Location: Left Arm, Patient Position: Sitting, Cuff Size: Normal)   Pulse 80   Ht 5\' 8"  (1.727 m)   Wt 184 lb 9.6 oz (83.7 kg)   SpO2 97%   BMI 28.07 kg/m   EXAM: Deferred  EKG: Deferred  ASSESSMENT: Probable familial hyperlipidemia, LDL greater than 190, based on Simon Broome criteria History of high triglycerides, ?  Familial combined hyperlipidemia Family history of high cholesterol in both  parents and cardiovascular disease in his father and paternal aunt  PLAN: 1.   Brent Petersen is interested in trying a PCSK9 inhibitor Repatha today.  We talked about the medication and that it is an injectable given every 2 weeks.  We may be able to provide a sample today.  If his cholesterol becomes significantly low we might be able to stop Zetia, but would recommend continuing his statin therapy.  Plan repeat lipid NMR and LP(a) in about 3 to 4 months and follow-up with Marcelino Duster.  Chrystie Nose, MD, [facility], FACP  [place]  [facility]  Medical Director of the Advanced Lipid Disorders &  Cardiovascular Risk Reduction Clinic Diplomate of the American Board of Clinical Lipidology Attending Cardiologist  Direct Dial: [phone]  Fax: [phone]  Website:  [email or web address] 01/14/2024, 9:52 AM

## 2024-01-14 NOTE — Patient Instructions (Addendum)
 Medication Instructions:  START Repatha as advised   Inject one dose, every 14 days   *If you need a refill on your cardiac medications before your next appointment, please call your pharmacy*  Lab Work: FASTING lab work in 3-4 months  If you have labs (blood work) drawn today and your tests are completely normal, you will receive your results only by: [employer] (if you have MyChart) OR A paper copy in the mail If you have any lab test that is abnormal or we need to change your treatment, we will call you to review the results.   Follow-Up: At [facility], you and your health needs are our priority.  As part of our continuing mission to provide you with exceptional heart care, our providers are all part of one team.  This team includes your primary Cardiologist (physician) and Advanced Practice Providers or APPs (Physician Assistants and Nurse Practitioners) who all work together to provide you with the care you need, when you need it.  Your next appointment:   3-4 months with Eligha Bridegroom NP - lipid clinic  We recommend signing up for the patient portal called "MyChart".  Sign up information is provided on this After Visit Summary.  MyChart is used to connect with patients for Virtual Visits (Telemedicine).  Patients are able to view lab/test results, encounter notes, upcoming appointments, etc.  Non-urgent messages can be sent to your provider as well.   To learn more about what you can do with MyChart, go to [email or web address].         1st Floor: - Lobby - Registration  - Pharmacy  - Lab - Cafe  2nd Floor: - PV Lab - Diagnostic Testing (echo, CT, nuclear med)  3rd Floor: - Vacant  4th Floor: - TCTS (cardiothoracic surgery) - AFib Clinic - Structural Heart Clinic - Vascular Surgery  - Vascular Ultrasound  5th Floor: - HeartCare Cardiology (general and EP) - Clinical Pharmacy for coumadin, hypertension, lipid, weight-loss medications, and  med management appointments    Valet parking services will be available as well.

## 2024-04-28 DIAGNOSIS — F17210 Nicotine dependence, cigarettes, uncomplicated: Secondary | ICD-10-CM | POA: Diagnosis not present

## 2024-04-28 DIAGNOSIS — E7800 Pure hypercholesterolemia, unspecified: Secondary | ICD-10-CM | POA: Diagnosis not present

## 2024-04-28 DIAGNOSIS — F411 Generalized anxiety disorder: Secondary | ICD-10-CM | POA: Diagnosis not present

## 2024-04-28 DIAGNOSIS — Z131 Encounter for screening for diabetes mellitus: Secondary | ICD-10-CM | POA: Diagnosis not present

## 2024-04-28 DIAGNOSIS — M25521 Pain in right elbow: Secondary | ICD-10-CM | POA: Diagnosis not present

## 2024-04-28 DIAGNOSIS — K219 Gastro-esophageal reflux disease without esophagitis: Secondary | ICD-10-CM | POA: Diagnosis not present

## 2024-04-28 DIAGNOSIS — Z0000 Encounter for general adult medical examination without abnormal findings: Secondary | ICD-10-CM | POA: Diagnosis not present

## 2024-07-25 DIAGNOSIS — J301 Allergic rhinitis due to pollen: Secondary | ICD-10-CM | POA: Diagnosis not present

## 2024-07-25 DIAGNOSIS — J3081 Allergic rhinitis due to animal (cat) (dog) hair and dander: Secondary | ICD-10-CM | POA: Diagnosis not present

## 2024-07-25 DIAGNOSIS — J3089 Other allergic rhinitis: Secondary | ICD-10-CM | POA: Diagnosis not present

## 2024-07-25 DIAGNOSIS — H1045 Other chronic allergic conjunctivitis: Secondary | ICD-10-CM | POA: Diagnosis not present

## 2024-09-26 DIAGNOSIS — M5136 Other intervertebral disc degeneration, lumbar region with discogenic back pain only: Secondary | ICD-10-CM | POA: Diagnosis not present

## 2024-09-26 DIAGNOSIS — M9903 Segmental and somatic dysfunction of lumbar region: Secondary | ICD-10-CM | POA: Diagnosis not present

## 2024-09-26 DIAGNOSIS — M9901 Segmental and somatic dysfunction of cervical region: Secondary | ICD-10-CM | POA: Diagnosis not present

## 2024-09-26 DIAGNOSIS — M5030 Other cervical disc degeneration, unspecified cervical region: Secondary | ICD-10-CM | POA: Diagnosis not present

## 2024-10-03 DIAGNOSIS — M5030 Other cervical disc degeneration, unspecified cervical region: Secondary | ICD-10-CM | POA: Diagnosis not present

## 2024-10-03 DIAGNOSIS — M9901 Segmental and somatic dysfunction of cervical region: Secondary | ICD-10-CM | POA: Diagnosis not present

## 2024-10-03 DIAGNOSIS — M5136 Other intervertebral disc degeneration, lumbar region with discogenic back pain only: Secondary | ICD-10-CM | POA: Diagnosis not present

## 2024-10-03 DIAGNOSIS — M9903 Segmental and somatic dysfunction of lumbar region: Secondary | ICD-10-CM | POA: Diagnosis not present
